# Patient Record
Sex: Female | Born: 1975
Health system: Southern US, Community
[De-identification: ages and names within clinical notes are randomized; demographics above are authoritative.]

## PROBLEM LIST (undated history)

## (undated) DIAGNOSIS — S3769XA Other injury of uterus, initial encounter: Secondary | ICD-10-CM

## (undated) HISTORY — PX: DILATION AND CURETTAGE OF UTERUS: SHX78

## (undated) HISTORY — PX: BREAST REDUCTION SURGERY: SHX8

## (undated) HISTORY — DX: Other injury of uterus, initial encounter: S37.69XA

---

## 1998-08-09 ENCOUNTER — Inpatient Hospital Stay (HOSPITAL_COMMUNITY): Admission: AD | Admit: 1998-08-09 | Discharge: 1998-08-09 | Payer: Self-pay | Admitting: *Deleted

## 1998-12-07 ENCOUNTER — Inpatient Hospital Stay (HOSPITAL_COMMUNITY): Admission: AD | Admit: 1998-12-07 | Discharge: 1998-12-09 | Payer: Self-pay | Admitting: Obstetrics & Gynecology

## 1999-01-10 ENCOUNTER — Other Ambulatory Visit: Admission: RE | Admit: 1999-01-10 | Discharge: 1999-01-10 | Payer: Self-pay | Admitting: Obstetrics & Gynecology

## 2000-02-21 ENCOUNTER — Other Ambulatory Visit: Admission: RE | Admit: 2000-02-21 | Discharge: 2000-02-21 | Payer: Self-pay | Admitting: Obstetrics & Gynecology

## 2001-11-29 ENCOUNTER — Inpatient Hospital Stay (HOSPITAL_COMMUNITY): Admission: AD | Admit: 2001-11-29 | Discharge: 2001-11-29 | Payer: Self-pay | Admitting: *Deleted

## 2001-12-24 ENCOUNTER — Inpatient Hospital Stay (HOSPITAL_COMMUNITY): Admission: AD | Admit: 2001-12-24 | Discharge: 2001-12-26 | Payer: Self-pay | Admitting: Obstetrics & Gynecology

## 2003-07-08 ENCOUNTER — Other Ambulatory Visit: Admission: RE | Admit: 2003-07-08 | Discharge: 2003-07-08 | Payer: Self-pay | Admitting: Obstetrics & Gynecology

## 2005-05-15 ENCOUNTER — Other Ambulatory Visit: Admission: RE | Admit: 2005-05-15 | Discharge: 2005-05-15 | Payer: Self-pay | Admitting: Obstetrics & Gynecology

## 2006-08-19 HISTORY — PX: REDUCTION MAMMAPLASTY: SUR839

## 2012-01-18 DIAGNOSIS — S3769XA Other injury of uterus, initial encounter: Secondary | ICD-10-CM

## 2012-01-18 HISTORY — PX: TUBAL LIGATION: SHX77

## 2012-01-18 HISTORY — DX: Other injury of uterus, initial encounter: S37.69XA

## 2012-01-30 ENCOUNTER — Encounter: Payer: Self-pay | Admitting: Internal Medicine

## 2012-01-30 ENCOUNTER — Other Ambulatory Visit (HOSPITAL_COMMUNITY): Payer: Self-pay | Admitting: Obstetrics & Gynecology

## 2012-01-30 DIAGNOSIS — R1031 Right lower quadrant pain: Secondary | ICD-10-CM

## 2012-02-04 HISTORY — PX: IUD REMOVAL: SHX5392

## 2012-02-05 ENCOUNTER — Ambulatory Visit (HOSPITAL_COMMUNITY): Payer: Self-pay

## 2012-02-10 ENCOUNTER — Ambulatory Visit (HOSPITAL_COMMUNITY)
Admission: RE | Admit: 2012-02-10 | Discharge: 2012-02-10 | Disposition: A | Discharge status: Home / Self Care | Payer: Managed Care, Other (non HMO) | Source: Ambulatory Visit | Attending: Obstetrics & Gynecology | Admitting: Obstetrics & Gynecology

## 2012-02-10 DIAGNOSIS — R1031 Right lower quadrant pain: Secondary | ICD-10-CM

## 2012-02-10 DIAGNOSIS — R1011 Right upper quadrant pain: Secondary | ICD-10-CM | POA: Insufficient documentation

## 2012-02-27 ENCOUNTER — Ambulatory Visit: Payer: Self-pay | Admitting: Internal Medicine

## 2018-05-12 ENCOUNTER — Encounter: Payer: Self-pay | Admitting: Family Medicine

## 2018-05-12 ENCOUNTER — Ambulatory Visit: Payer: Self-pay | Admitting: Family Medicine

## 2018-05-12 VITALS — BP 120/82 | HR 88 | Temp 98.5°F | Wt 159.6 lb

## 2018-05-12 DIAGNOSIS — J0110 Acute frontal sinusitis, unspecified: Secondary | ICD-10-CM

## 2018-05-12 MED ORDER — FLUTICASONE PROPIONATE 50 MCG/ACT NA SUSP: 2.0000 | Freq: Every day | NASAL | 6 refills | Status: AC

## 2018-05-12 MED ORDER — CETIRIZINE HCL 10 MG PO TABS: 10.0000 mg | ORAL_TABLET | Freq: Every day | ORAL | 0 refills | Status: AC

## 2018-05-12 MED ORDER — PSEUDOEPH-BROMPHEN-DM 30-2-10 MG/5ML PO SYRP: 10.0000 mL | ORAL_SOLUTION | Freq: Three times a day (TID) | ORAL | 0 refills | Status: AC | PRN

## 2018-05-12 MED ORDER — AMOXICILLIN-POT CLAVULANATE 875-125 MG PO TABS: 1.0000 | ORAL_TABLET | Freq: Two times a day (BID) | ORAL | 0 refills | Status: DC

## 2018-05-12 NOTE — Patient Instructions (Signed)

## 2018-05-12 NOTE — Progress Notes (Signed)
Subjective:  Alyssa Clarke is a 42 y.o. female who presents for evaluation of possible sinusitis.  Symptoms include cough described as productive, facial pain, fever: suspected fevers but not measured at home, headache described as in frontal sinuses and nasal congestion.  Onset of symptoms was 9 days ago, and has been rapidly worsening since that time.  Treatment to date:  cough suppressants.  High risk factors for influenza complications:  Denies any body ache, high fever.  The following portions of the patient's history were reviewed and updated as appropriate:  allergies, current medications and past medical history.  Review of Systems  Constitutional: Positive for fever.  HENT: Positive for congestion and sinus pain.   Respiratory: Positive for cough and sputum production. Negative for shortness of breath and wheezing.   Cardiovascular: Negative for chest pain.  Gastrointestinal: Negative for abdominal pain, nausea and vomiting.  Musculoskeletal: Negative for myalgias.  Neurological: Positive for headaches. Loss of consciousness:     Objective:  General appearance: no distress   Physical Exam  Constitutional: She is oriented to person, place, and time and well-developed, well-nourished, and in no distress. No distress.  HENT:  Head: Normocephalic and atraumatic.  Nose: Rhinorrhea (clear, trace bilaterally) and sinus tenderness (over maxillary and frontal sinuses) present.  Mouth/Throat: Oropharyngeal exudate (clear postanasal drainage) present.  Cardiovascular: Normal rate and regular rhythm.  Pulmonary/Chest: No respiratory distress. She has no wheezes. She has no rhonchi. She has no rales.  Abdominal: Soft. Bowel sounds are normal. There is no tenderness.  Neurological: She is alert and oriented to person, place, and time.  Skin: Skin is warm and dry.     Assessment:   1. Acute frontal sinusitis, recurrence not specified        Plan:  Discussed diagnosis and treatment  of sinusitis.  1. Acute frontal sinusitis, recurrence not specified - cetirizine (ZYRTEC) 10 MG tablet; Take 1 tablet (10 mg total) by mouth daily. - fluticasone (FLONASE) 50 MCG/ACT nasal spray; Place 2 sprays into both nostrils daily. - brompheniramine-pseudoephedrine-DM 30-2-10 MG/5ML syrup; Take 10 mLs by mouth 3 (three) times daily as needed. - amoxicillin-clavulanate (AUGMENTIN) 875-125 MG tablet; Take 1 tablet by mouth 2 (two) times daily.   Advised patient that if cough worsens, return to our clinic or follow up with your primary care providers.

## 2018-05-14 ENCOUNTER — Telehealth: Payer: Self-pay

## 2018-05-14 NOTE — Telephone Encounter (Signed)
Patient was not available.

## 2018-09-16 DIAGNOSIS — Z6828 Body mass index (BMI) 28.0-28.9, adult: Secondary | ICD-10-CM | POA: Diagnosis not present

## 2018-09-16 DIAGNOSIS — Z01419 Encounter for gynecological examination (general) (routine) without abnormal findings: Secondary | ICD-10-CM | POA: Diagnosis not present

## 2018-09-16 DIAGNOSIS — Z1231 Encounter for screening mammogram for malignant neoplasm of breast: Secondary | ICD-10-CM | POA: Diagnosis not present

## 2018-09-17 ENCOUNTER — Other Ambulatory Visit: Payer: Self-pay | Admitting: Obstetrics & Gynecology

## 2018-09-17 DIAGNOSIS — R928 Other abnormal and inconclusive findings on diagnostic imaging of breast: Secondary | ICD-10-CM

## 2018-09-23 ENCOUNTER — Ambulatory Visit: Payer: Managed Care, Other (non HMO)

## 2018-09-23 ENCOUNTER — Ambulatory Visit
Admission: RE | Admit: 2018-09-23 | Discharge: 2018-09-23 | Disposition: A | Discharge status: Home / Self Care | Payer: BLUE CROSS/BLUE SHIELD | Source: Ambulatory Visit | Attending: Obstetrics & Gynecology | Admitting: Obstetrics & Gynecology

## 2018-09-23 DIAGNOSIS — R928 Other abnormal and inconclusive findings on diagnostic imaging of breast: Secondary | ICD-10-CM

## 2018-09-23 DIAGNOSIS — R922 Inconclusive mammogram: Secondary | ICD-10-CM | POA: Diagnosis not present

## 2018-09-30 DIAGNOSIS — K648 Other hemorrhoids: Secondary | ICD-10-CM | POA: Diagnosis not present

## 2018-09-30 DIAGNOSIS — K644 Residual hemorrhoidal skin tags: Secondary | ICD-10-CM | POA: Diagnosis not present

## 2018-10-01 DIAGNOSIS — F320 Major depressive disorder, single episode, mild: Secondary | ICD-10-CM | POA: Diagnosis not present

## 2018-10-01 DIAGNOSIS — Z1389 Encounter for screening for other disorder: Secondary | ICD-10-CM | POA: Diagnosis not present

## 2018-10-01 DIAGNOSIS — E663 Overweight: Secondary | ICD-10-CM | POA: Diagnosis not present

## 2018-10-01 DIAGNOSIS — Z6829 Body mass index (BMI) 29.0-29.9, adult: Secondary | ICD-10-CM | POA: Diagnosis not present

## 2018-10-28 DIAGNOSIS — E663 Overweight: Secondary | ICD-10-CM | POA: Diagnosis not present

## 2018-10-28 DIAGNOSIS — Z1389 Encounter for screening for other disorder: Secondary | ICD-10-CM | POA: Diagnosis not present

## 2018-10-28 DIAGNOSIS — Z6828 Body mass index (BMI) 28.0-28.9, adult: Secondary | ICD-10-CM | POA: Diagnosis not present

## 2018-10-28 DIAGNOSIS — J069 Acute upper respiratory infection, unspecified: Secondary | ICD-10-CM | POA: Diagnosis not present

## 2018-10-28 DIAGNOSIS — J111 Influenza due to unidentified influenza virus with other respiratory manifestations: Secondary | ICD-10-CM | POA: Diagnosis not present

## 2019-02-24 DIAGNOSIS — E663 Overweight: Secondary | ICD-10-CM | POA: Diagnosis not present

## 2019-02-24 DIAGNOSIS — M461 Sacroiliitis, not elsewhere classified: Secondary | ICD-10-CM | POA: Diagnosis not present

## 2019-02-24 DIAGNOSIS — Z1389 Encounter for screening for other disorder: Secondary | ICD-10-CM | POA: Diagnosis not present

## 2019-02-24 DIAGNOSIS — Z6828 Body mass index (BMI) 28.0-28.9, adult: Secondary | ICD-10-CM | POA: Diagnosis not present

## 2019-04-06 DIAGNOSIS — F900 Attention-deficit hyperactivity disorder, predominantly inattentive type: Secondary | ICD-10-CM | POA: Diagnosis not present

## 2019-04-06 DIAGNOSIS — F3342 Major depressive disorder, recurrent, in full remission: Secondary | ICD-10-CM | POA: Diagnosis not present

## 2019-08-24 DIAGNOSIS — F4323 Adjustment disorder with mixed anxiety and depressed mood: Secondary | ICD-10-CM | POA: Diagnosis not present

## 2019-09-28 ENCOUNTER — Ambulatory Visit (HOSPITAL_COMMUNITY)
Admission: RE | Admit: 2019-09-28 | Discharge: 2019-09-28 | Disposition: A | Discharge status: Home / Self Care | Payer: BC Managed Care – PPO | Source: Ambulatory Visit | Attending: Family Medicine | Admitting: Family Medicine

## 2019-09-28 ENCOUNTER — Other Ambulatory Visit (HOSPITAL_COMMUNITY): Payer: Self-pay | Admitting: Family Medicine

## 2019-09-28 ENCOUNTER — Other Ambulatory Visit: Payer: Self-pay

## 2019-09-28 DIAGNOSIS — Z1389 Encounter for screening for other disorder: Secondary | ICD-10-CM | POA: Diagnosis not present

## 2019-09-28 DIAGNOSIS — Z6829 Body mass index (BMI) 29.0-29.9, adult: Secondary | ICD-10-CM | POA: Diagnosis not present

## 2019-09-28 DIAGNOSIS — M549 Dorsalgia, unspecified: Secondary | ICD-10-CM

## 2019-09-28 DIAGNOSIS — E663 Overweight: Secondary | ICD-10-CM | POA: Diagnosis not present

## 2019-11-25 DIAGNOSIS — F900 Attention-deficit hyperactivity disorder, predominantly inattentive type: Secondary | ICD-10-CM | POA: Diagnosis not present

## 2019-11-25 DIAGNOSIS — F3342 Major depressive disorder, recurrent, in full remission: Secondary | ICD-10-CM | POA: Diagnosis not present

## 2019-12-29 DIAGNOSIS — Z6826 Body mass index (BMI) 26.0-26.9, adult: Secondary | ICD-10-CM | POA: Diagnosis not present

## 2019-12-29 DIAGNOSIS — Z01419 Encounter for gynecological examination (general) (routine) without abnormal findings: Secondary | ICD-10-CM | POA: Diagnosis not present

## 2019-12-29 DIAGNOSIS — Z1231 Encounter for screening mammogram for malignant neoplasm of breast: Secondary | ICD-10-CM | POA: Diagnosis not present

## 2019-12-30 DIAGNOSIS — Z01419 Encounter for gynecological examination (general) (routine) without abnormal findings: Secondary | ICD-10-CM | POA: Diagnosis not present

## 2020-01-03 ENCOUNTER — Other Ambulatory Visit: Payer: Self-pay | Admitting: Obstetrics & Gynecology

## 2020-01-03 DIAGNOSIS — R928 Other abnormal and inconclusive findings on diagnostic imaging of breast: Secondary | ICD-10-CM

## 2020-01-04 DIAGNOSIS — M533 Sacrococcygeal disorders, not elsewhere classified: Secondary | ICD-10-CM | POA: Diagnosis not present

## 2020-01-04 DIAGNOSIS — N83201 Unspecified ovarian cyst, right side: Secondary | ICD-10-CM | POA: Diagnosis not present

## 2020-01-24 ENCOUNTER — Other Ambulatory Visit: Payer: Self-pay

## 2020-01-24 ENCOUNTER — Ambulatory Visit
Admission: RE | Admit: 2020-01-24 | Discharge: 2020-01-24 | Disposition: A | Discharge status: Home / Self Care | Payer: BC Managed Care – PPO | Source: Ambulatory Visit | Attending: Obstetrics & Gynecology | Admitting: Obstetrics & Gynecology

## 2020-01-24 ENCOUNTER — Other Ambulatory Visit: Payer: Self-pay | Admitting: Obstetrics & Gynecology

## 2020-01-24 DIAGNOSIS — R928 Other abnormal and inconclusive findings on diagnostic imaging of breast: Secondary | ICD-10-CM | POA: Diagnosis not present

## 2020-01-24 DIAGNOSIS — Z853 Personal history of malignant neoplasm of breast: Secondary | ICD-10-CM | POA: Diagnosis not present

## 2020-01-24 DIAGNOSIS — N6323 Unspecified lump in the left breast, lower outer quadrant: Secondary | ICD-10-CM | POA: Diagnosis not present

## 2020-01-25 DIAGNOSIS — M545 Low back pain: Secondary | ICD-10-CM | POA: Diagnosis not present

## 2020-02-16 DIAGNOSIS — M545 Low back pain: Secondary | ICD-10-CM | POA: Diagnosis not present

## 2020-03-14 DIAGNOSIS — M545 Low back pain: Secondary | ICD-10-CM | POA: Diagnosis not present

## 2020-03-16 DIAGNOSIS — M545 Low back pain: Secondary | ICD-10-CM | POA: Diagnosis not present

## 2020-03-23 DIAGNOSIS — M545 Low back pain: Secondary | ICD-10-CM | POA: Diagnosis not present

## 2020-03-28 DIAGNOSIS — M545 Low back pain: Secondary | ICD-10-CM | POA: Diagnosis not present

## 2020-03-30 DIAGNOSIS — M545 Low back pain: Secondary | ICD-10-CM | POA: Diagnosis not present

## 2020-04-06 DIAGNOSIS — M545 Low back pain: Secondary | ICD-10-CM | POA: Diagnosis not present

## 2020-04-11 DIAGNOSIS — M545 Low back pain: Secondary | ICD-10-CM | POA: Diagnosis not present

## 2020-04-13 DIAGNOSIS — M545 Low back pain: Secondary | ICD-10-CM | POA: Diagnosis not present

## 2020-05-23 DIAGNOSIS — E663 Overweight: Secondary | ICD-10-CM | POA: Diagnosis not present

## 2020-05-23 DIAGNOSIS — M5410 Radiculopathy, site unspecified: Secondary | ICD-10-CM | POA: Diagnosis not present

## 2020-05-23 DIAGNOSIS — Z6827 Body mass index (BMI) 27.0-27.9, adult: Secondary | ICD-10-CM | POA: Diagnosis not present

## 2020-07-19 DIAGNOSIS — F4323 Adjustment disorder with mixed anxiety and depressed mood: Secondary | ICD-10-CM | POA: Diagnosis not present

## 2020-07-25 DIAGNOSIS — F4323 Adjustment disorder with mixed anxiety and depressed mood: Secondary | ICD-10-CM | POA: Diagnosis not present

## 2020-07-27 ENCOUNTER — Other Ambulatory Visit: Payer: Self-pay

## 2020-07-27 ENCOUNTER — Ambulatory Visit
Admission: RE | Admit: 2020-07-27 | Discharge: 2020-07-27 | Disposition: A | Discharge status: Home / Self Care | Payer: BC Managed Care – PPO | Source: Ambulatory Visit | Attending: Obstetrics & Gynecology | Admitting: Obstetrics & Gynecology

## 2020-07-27 DIAGNOSIS — R928 Other abnormal and inconclusive findings on diagnostic imaging of breast: Secondary | ICD-10-CM

## 2020-07-27 DIAGNOSIS — N6002 Solitary cyst of left breast: Secondary | ICD-10-CM | POA: Diagnosis not present

## 2020-08-02 DIAGNOSIS — F4323 Adjustment disorder with mixed anxiety and depressed mood: Secondary | ICD-10-CM | POA: Diagnosis not present

## 2020-08-23 DIAGNOSIS — F4323 Adjustment disorder with mixed anxiety and depressed mood: Secondary | ICD-10-CM | POA: Diagnosis not present

## 2020-08-30 DIAGNOSIS — F3342 Major depressive disorder, recurrent, in full remission: Secondary | ICD-10-CM | POA: Diagnosis not present

## 2020-08-30 DIAGNOSIS — F4321 Adjustment disorder with depressed mood: Secondary | ICD-10-CM | POA: Diagnosis not present

## 2020-10-17 ENCOUNTER — Encounter: Payer: Self-pay | Admitting: Emergency Medicine

## 2020-10-17 ENCOUNTER — Other Ambulatory Visit: Payer: Self-pay

## 2020-10-17 ENCOUNTER — Ambulatory Visit
Admission: EM | Admit: 2020-10-17 | Discharge: 2020-10-17 | Disposition: A | Discharge status: Home / Self Care | Payer: BC Managed Care – PPO

## 2020-10-17 DIAGNOSIS — S09302A Unspecified injury of left middle and inner ear, initial encounter: Secondary | ICD-10-CM

## 2020-10-17 DIAGNOSIS — H9202 Otalgia, left ear: Secondary | ICD-10-CM | POA: Diagnosis not present

## 2020-10-17 NOTE — Discharge Instructions (Signed)
Blood visualized behind the ear drum, most likely secondary to ear trauma Rest  Avoid painful activities Alternate ibuprofen and/ or tylenol as needed for pain control Follow up with PCP this week for recheck Return here or go to the ER if you have any new or worsening symptoms fever, chills, nausea, vomiting, purulent drainage, worsening pain, etc..Marland Kitchen

## 2020-10-17 NOTE — ED Provider Notes (Signed)
[facility]   [id number] 10/17/20 Arrival Time: 1217  CC:EAR PAIN  SUBJECTIVE: History from: patient.  Alyssa Clarke is a 45 y.o. female who presents with of left ear pain that began this morning.  Symptoms began after accidentally shoving a q-tip down into LT ear while taking shirt off.  Patient states the pain is constant.  Denies alleviating or aggravating factors.  Denies similar symptoms in the past.   Denies blood in ear canal or on q-tip.   Complains of hearing changes.  Denies fever, chills, fatigue, chest pain, nausea, changes in bowel or bladder habits.    ROS: As per HPI.  All other pertinent ROS negative.     Past Medical History:  Diagnosis Date  . Perforation of uterus 01/2012   from IUD   Past Surgical History:  Procedure Laterality Date  . BREAST REDUCTION SURGERY    . DILATION AND CURETTAGE OF UTERUS    . IUD REMOVAL  02/04/2012  . REDUCTION MAMMAPLASTY Bilateral 2008  . TUBAL LIGATION  01/2012   bilateral  . VAGINAL DELIVERY     x 3   Allergies  Allergen Reactions  . Codeine Hives   No current facility-administered medications on file prior to encounter.   Current Outpatient Medications on File Prior to Encounter  Medication Sig Dispense Refill  . buPROPion (WELLBUTRIN SR) 150 MG 12 hr tablet Take 150 mg by mouth 2 (two) times daily.    Marland Kitchen escitalopram (LEXAPRO) 20 MG tablet Take 20 mg by mouth daily.    . brompheniramine-pseudoephedrine-DM 30-2-10 MG/5ML syrup Take 10 mLs by mouth 3 (three) times daily as needed. 120 mL 0  . cetirizine (ZYRTEC) 10 MG tablet Take 1 tablet (10 mg total) by mouth daily. 30 tablet 0  . fluticasone (FLONASE) 50 MCG/ACT nasal spray Place 2 sprays into both nostrils daily. 16 g 6   Social History   Socioeconomic History  . Marital status: Married    Spouse name: Not on file  . Number of children: Not on file  . Years of education: Not on file  . Highest education level: Not on file  Occupational History  . Not  on file  Tobacco Use  . Smoking status: Never Smoker  . Smokeless tobacco: Never Used  Substance and Sexual Activity  . Alcohol use: Not Currently  . Drug use: Never  . Sexual activity: Not on file  Other Topics Concern  . Not on file  Social History Narrative  . Not on file   Social Determinants of Health   Financial Resource Strain: Not on file  Food Insecurity: Not on file  Transportation Needs: Not on file  Physical Activity: Not on file  Stress: Not on file  Social Connections: Not on file  Intimate Partner Violence: Not on file   Family History  Problem Relation Age of Onset  . Colon cancer Other     OBJECTIVE:  Vitals:   10/17/20 1251  BP: 131/79  Pulse: 85  Resp: 18  Temp: 98.9 F (37.2 C)  TempSrc: Oral  SpO2: 96%     General appearance: alert; well-appearing, nontoxic; speaking in full sentences and tolerating own secretions HEENT: NCAT; Ears: EACs clear, RT TM pearly gray, blood present behind LT TM; Eyes: PERRL.  EOM grossly intact.Nose: nares patent without rhinorrhea, Throat: oropharynx clear, tonsils non erythematous or enlarged, uvula midline  Neck: supple without LAD Lungs: normal respirations Skin: warm and dry Psychological: alert and cooperative; normal mood and affect  ASSESSMENT & PLAN:  1. Ear pain, left   2. Injury of tympanic membrane of left ear, initial encounter    Blood visualized behind the ear drum, most likely secondary to ear trauma Rest  Avoid painful activities Alternate ibuprofen and/ or tylenol as needed for pain control Follow up with PCP this week for recheck Return here or go to the ER if you have any new or worsening symptoms fever, chills, nausea, vomiting, purulent drainage, worsening pain, etc...  Reviewed expectations re: course of current medical issues. Questions answered. Outlined signs and symptoms indicating need for more acute intervention. Patient verbalized understanding. After Visit Summary  given.         Rennis Harding, PA-C 10/17/20 1323

## 2020-10-17 NOTE — ED Triage Notes (Signed)
Q-tip was pushed down in left ear.  Can't hear, feels like there is some liquid in ear.

## 2020-12-21 DIAGNOSIS — S233XXA Sprain of ligaments of thoracic spine, initial encounter: Secondary | ICD-10-CM | POA: Diagnosis not present

## 2020-12-21 DIAGNOSIS — S336XXA Sprain of sacroiliac joint, initial encounter: Secondary | ICD-10-CM | POA: Diagnosis not present

## 2020-12-21 DIAGNOSIS — S134XXA Sprain of ligaments of cervical spine, initial encounter: Secondary | ICD-10-CM | POA: Diagnosis not present

## 2020-12-28 DIAGNOSIS — S134XXA Sprain of ligaments of cervical spine, initial encounter: Secondary | ICD-10-CM | POA: Diagnosis not present

## 2020-12-28 DIAGNOSIS — S233XXA Sprain of ligaments of thoracic spine, initial encounter: Secondary | ICD-10-CM | POA: Diagnosis not present

## 2020-12-28 DIAGNOSIS — S336XXA Sprain of sacroiliac joint, initial encounter: Secondary | ICD-10-CM | POA: Diagnosis not present

## 2021-01-02 DIAGNOSIS — S336XXA Sprain of sacroiliac joint, initial encounter: Secondary | ICD-10-CM | POA: Diagnosis not present

## 2021-01-02 DIAGNOSIS — S134XXA Sprain of ligaments of cervical spine, initial encounter: Secondary | ICD-10-CM | POA: Diagnosis not present

## 2021-01-02 DIAGNOSIS — S233XXA Sprain of ligaments of thoracic spine, initial encounter: Secondary | ICD-10-CM | POA: Diagnosis not present

## 2021-01-03 DIAGNOSIS — Z01419 Encounter for gynecological examination (general) (routine) without abnormal findings: Secondary | ICD-10-CM | POA: Diagnosis not present

## 2021-01-03 DIAGNOSIS — Z6828 Body mass index (BMI) 28.0-28.9, adult: Secondary | ICD-10-CM | POA: Diagnosis not present

## 2021-01-03 DIAGNOSIS — Z1231 Encounter for screening mammogram for malignant neoplasm of breast: Secondary | ICD-10-CM | POA: Diagnosis not present

## 2021-01-04 DIAGNOSIS — S336XXA Sprain of sacroiliac joint, initial encounter: Secondary | ICD-10-CM | POA: Diagnosis not present

## 2021-01-04 DIAGNOSIS — S233XXA Sprain of ligaments of thoracic spine, initial encounter: Secondary | ICD-10-CM | POA: Diagnosis not present

## 2021-01-04 DIAGNOSIS — S134XXA Sprain of ligaments of cervical spine, initial encounter: Secondary | ICD-10-CM | POA: Diagnosis not present

## 2021-01-12 DIAGNOSIS — S336XXA Sprain of sacroiliac joint, initial encounter: Secondary | ICD-10-CM | POA: Diagnosis not present

## 2021-01-12 DIAGNOSIS — S134XXA Sprain of ligaments of cervical spine, initial encounter: Secondary | ICD-10-CM | POA: Diagnosis not present

## 2021-01-12 DIAGNOSIS — S233XXA Sprain of ligaments of thoracic spine, initial encounter: Secondary | ICD-10-CM | POA: Diagnosis not present

## 2021-03-14 DIAGNOSIS — E663 Overweight: Secondary | ICD-10-CM | POA: Diagnosis not present

## 2021-03-14 DIAGNOSIS — J329 Chronic sinusitis, unspecified: Secondary | ICD-10-CM | POA: Diagnosis not present

## 2021-03-14 DIAGNOSIS — F320 Major depressive disorder, single episode, mild: Secondary | ICD-10-CM | POA: Diagnosis not present

## 2021-03-14 DIAGNOSIS — Z6827 Body mass index (BMI) 27.0-27.9, adult: Secondary | ICD-10-CM | POA: Diagnosis not present

## 2021-03-19 ENCOUNTER — Other Ambulatory Visit (HOSPITAL_COMMUNITY): Payer: Self-pay | Admitting: Internal Medicine

## 2021-03-19 DIAGNOSIS — Z1231 Encounter for screening mammogram for malignant neoplasm of breast: Secondary | ICD-10-CM

## 2021-05-08 DIAGNOSIS — K648 Other hemorrhoids: Secondary | ICD-10-CM | POA: Diagnosis not present

## 2021-05-08 DIAGNOSIS — Z1211 Encounter for screening for malignant neoplasm of colon: Secondary | ICD-10-CM | POA: Diagnosis not present

## 2021-07-27 DIAGNOSIS — F900 Attention-deficit hyperactivity disorder, predominantly inattentive type: Secondary | ICD-10-CM | POA: Diagnosis not present

## 2021-07-27 DIAGNOSIS — F3342 Major depressive disorder, recurrent, in full remission: Secondary | ICD-10-CM | POA: Diagnosis not present

## 2021-09-11 DIAGNOSIS — J069 Acute upper respiratory infection, unspecified: Secondary | ICD-10-CM | POA: Diagnosis not present

## 2021-09-11 DIAGNOSIS — E663 Overweight: Secondary | ICD-10-CM | POA: Diagnosis not present

## 2021-10-13 IMAGING — MG MM DIGITAL DIAGNOSTIC UNILAT*L* W/ TOMO W/ CAD
4 series · 4 of 12 positions shown · non-contrast
Comparison: Previous exam(s).

CLINICAL DATA: Screening recall for a possible left breast mass.
The patient has history of reduction mammoplasty.

EXAM:
DIGITAL DIAGNOSTIC UNILATERAL LEFT MAMMOGRAM WITH CAD AND TOMO
LEFT BREAST ULTRASOUND

[L MLO synth-2D]
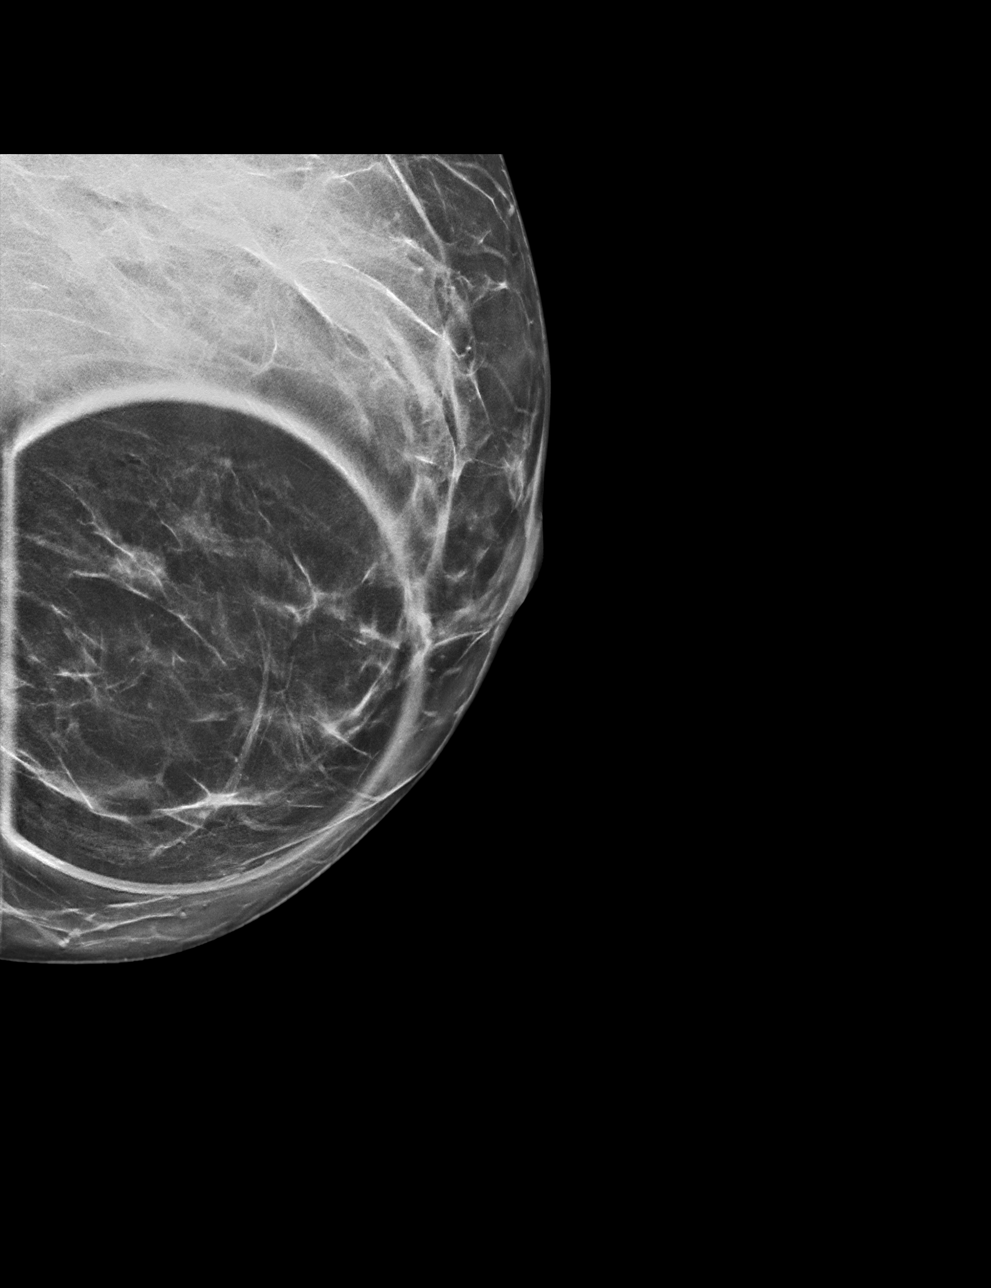

[L CC synth-2D]
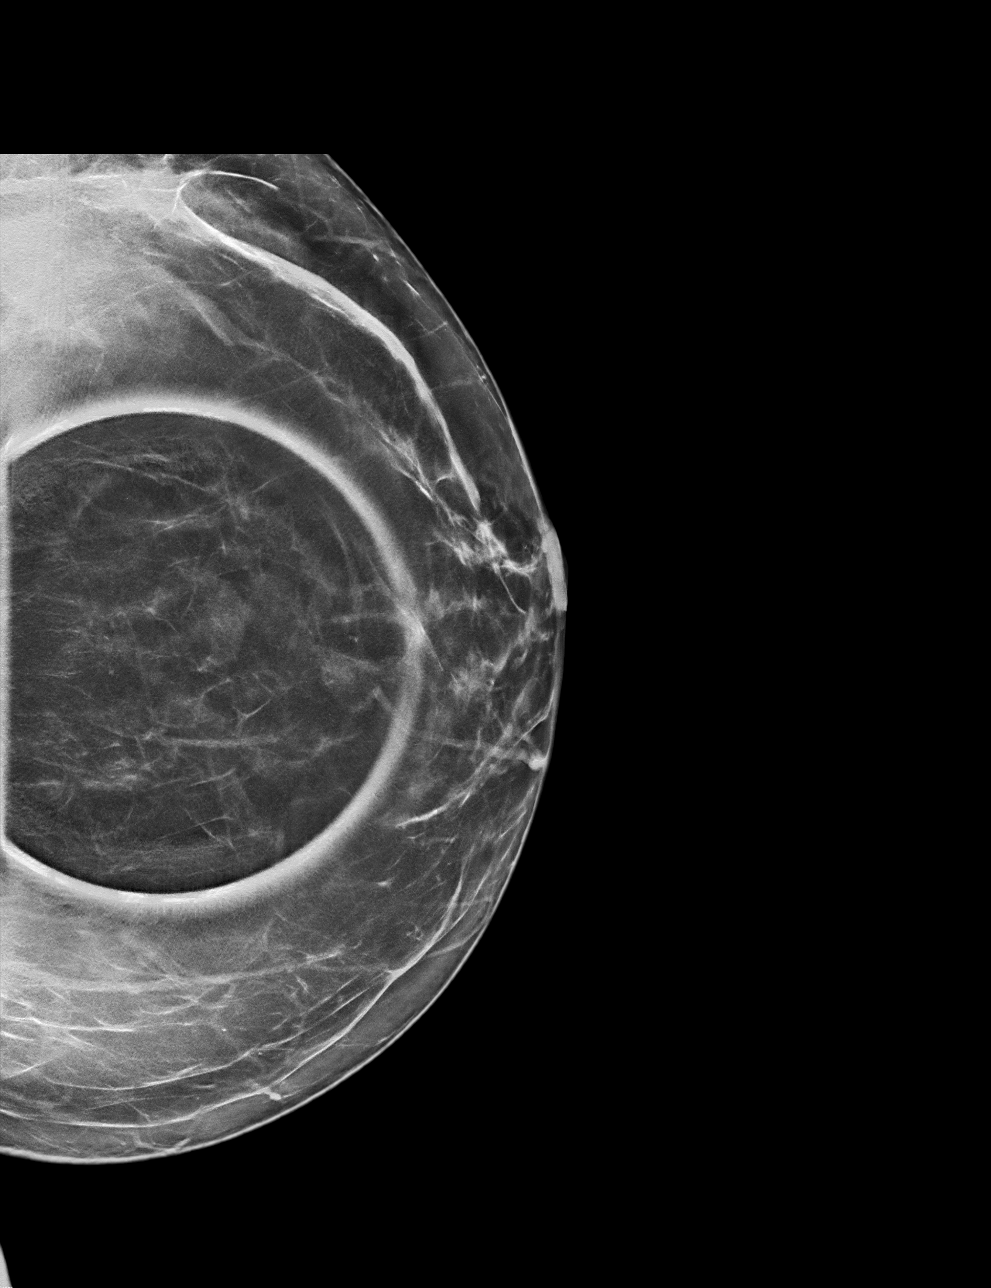

[L MLO tomo · tomo slice 35/69.0]
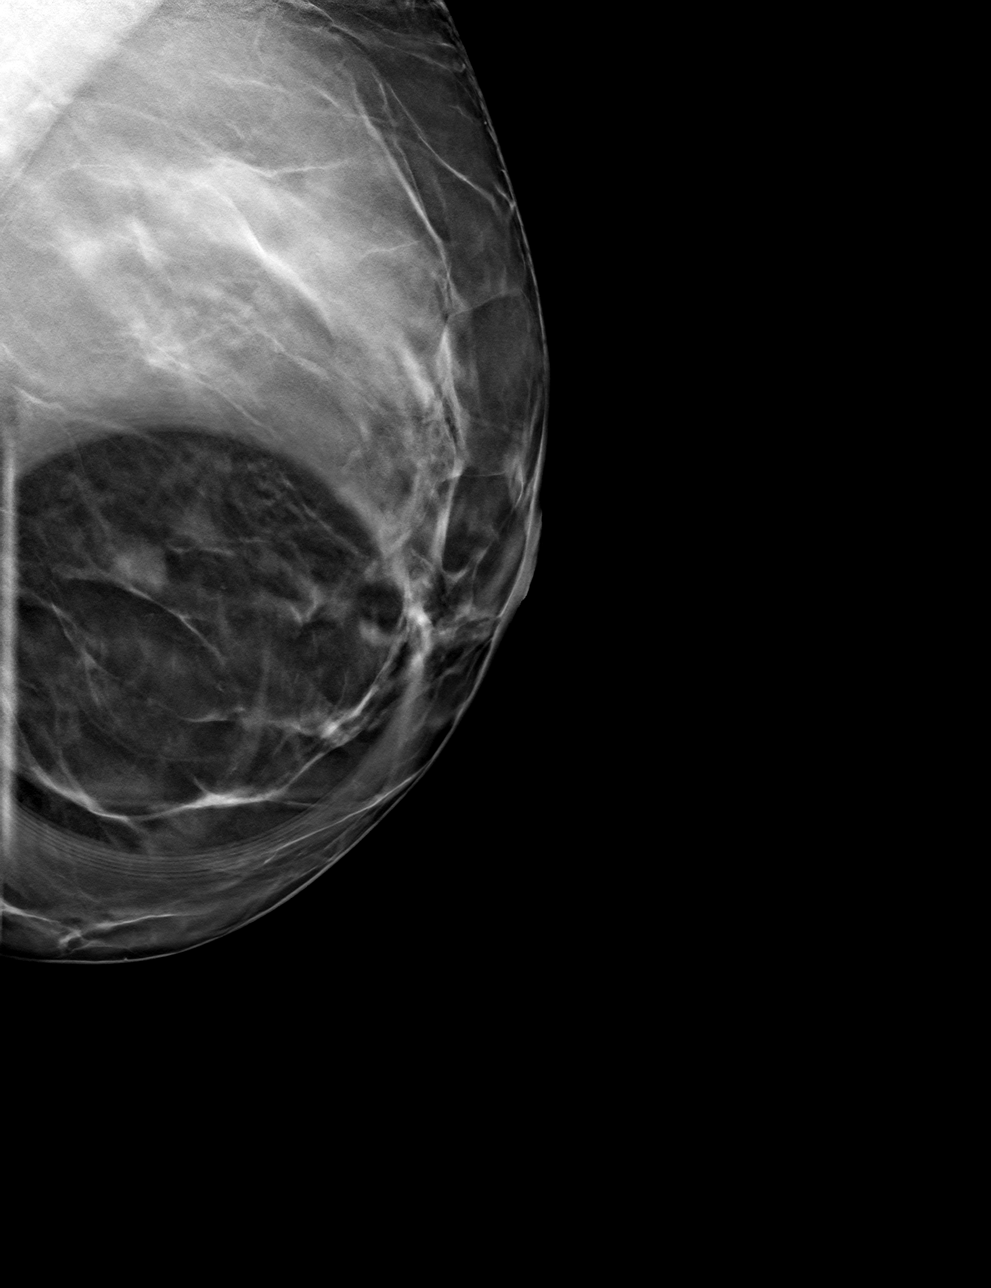

[L CC tomo · tomo slice 35/68.0]
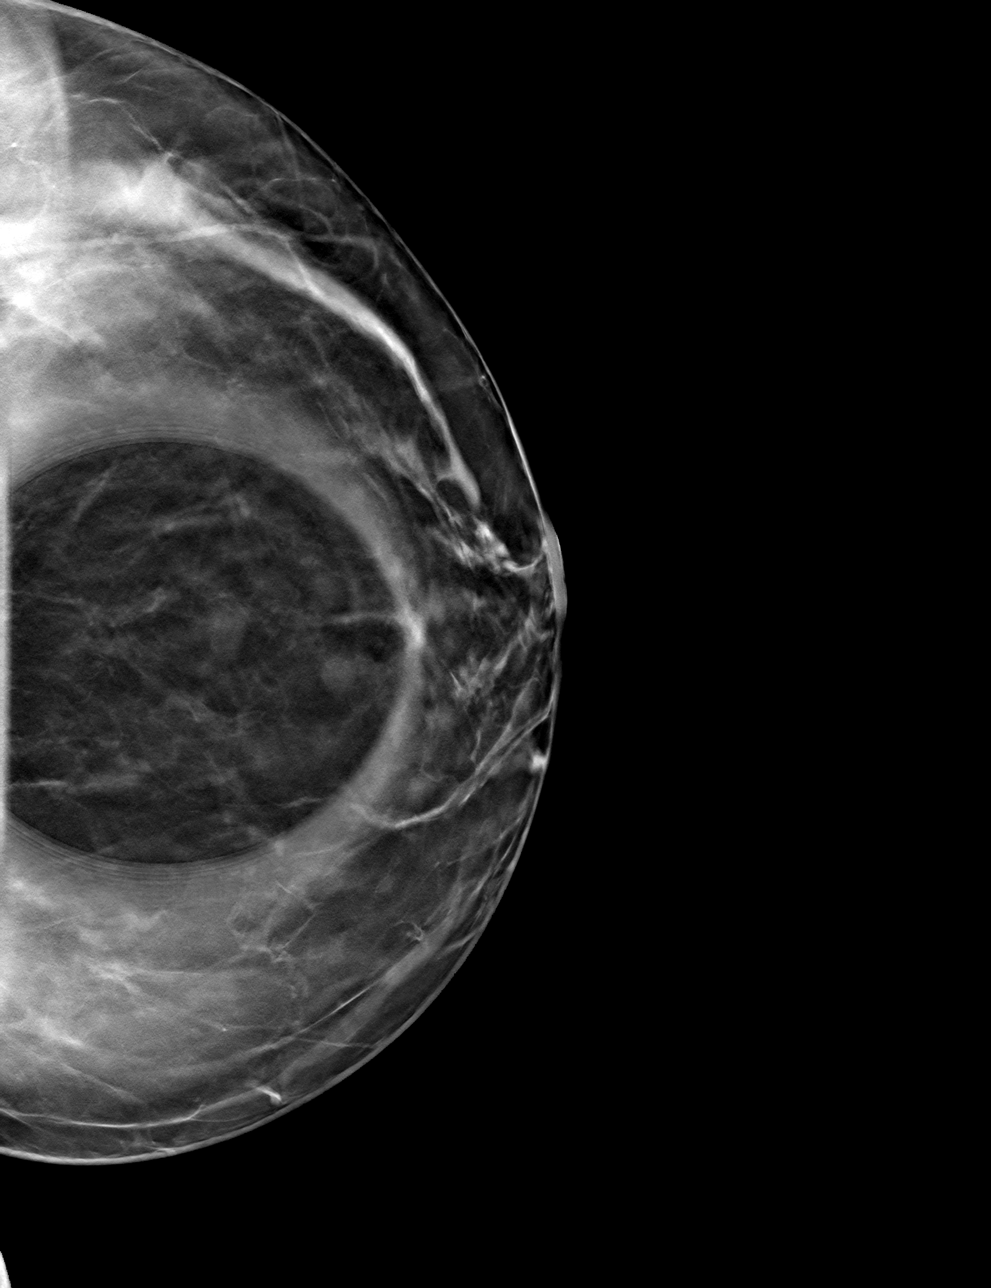

[4 of 12 positions shown; findings below may reference images not displayed]

ACR Breast Density Category b: There are scattered areas of
fibroglandular density.
FINDINGS: Spot compression tomosynthesis images through the inferior left
breast demonstrates an irregular mass with indistinct margins
measuring approximately 1.7 cm on the CC view.

Mammographic images were processed with CAD.

Ultrasound targeted to the left breast at [DATE], 3 cm from the nipple
demonstrates a circumscribed oval anechoic mass with internal
septations measuring 1.1 x 0 3 x 1.0 cm.
IMPRESSION: The mass in the left breast at [DATE] is likely benign, favored to
represent a complicated cyst.

RECOMMENDATION:
Six-month follow-up left breast ultrasound.

I have discussed the findings and recommendations with the patient.
If applicable, a reminder letter will be sent to the patient
regarding the next appointment.

BI-RADS CATEGORY  3: Probably benign.

## 2022-02-13 ENCOUNTER — Other Ambulatory Visit: Payer: Self-pay

## 2022-02-13 ENCOUNTER — Emergency Department (HOSPITAL_COMMUNITY): Payer: BC Managed Care – PPO

## 2022-02-13 ENCOUNTER — Emergency Department (HOSPITAL_COMMUNITY)
Admission: EM | Admit: 2022-02-13 | Discharge: 2022-02-13 | Discharge status: Against Medical Advice | Payer: BC Managed Care – PPO | Attending: Emergency Medicine | Admitting: Emergency Medicine

## 2022-02-13 ENCOUNTER — Encounter (HOSPITAL_COMMUNITY): Payer: Self-pay | Admitting: Pharmacy Technician

## 2022-02-13 DIAGNOSIS — F419 Anxiety disorder, unspecified: Secondary | ICD-10-CM | POA: Diagnosis not present

## 2022-02-13 DIAGNOSIS — Z5321 Procedure and treatment not carried out due to patient leaving prior to being seen by health care provider: Secondary | ICD-10-CM | POA: Insufficient documentation

## 2022-02-13 DIAGNOSIS — R0789 Other chest pain: Secondary | ICD-10-CM | POA: Diagnosis not present

## 2022-02-13 DIAGNOSIS — Y990 Civilian activity done for income or pay: Secondary | ICD-10-CM | POA: Diagnosis not present

## 2022-02-13 LAB — BASIC METABOLIC PANEL
Anion gap: 8 (ref 5–15)
BUN: 12 mg/dL (ref 6–20)
CO2: 23 mmol/L (ref 22–32)
Calcium: 8.8 mg/dL — ABNORMAL LOW (ref 8.9–10.3)
Chloride: 108 mmol/L (ref 98–111)
Creatinine, Ser: 0.77 mg/dL (ref 0.44–1.00)
GFR, Estimated: >60 mL/min (ref >60)
Glucose, Bld: 99 mg/dL (ref 70–99)
Potassium: 4.6 mmol/L (ref 3.5–5.1)
Sodium: 139 mmol/L (ref 135–145)

## 2022-02-13 LAB — CBC
HCT: 41.2 % (ref 36.0–46.0)
Hemoglobin: 13 g/dL (ref 12.0–15.0)
MCH: 30.9 pg (ref 26.0–34.0)
MCHC: 31.6 g/dL (ref 30.0–36.0)
MCV: 97.9 fL (ref 80.0–100.0)
Platelets: 346 10*3/uL (ref 150–400)
RBC: 4.21 MIL/uL (ref 3.87–5.11)
RDW: 13.1 % (ref 11.5–15.5)
WBC: 7.8 10*3/uL (ref 4.0–10.5)
nRBC: 0 % (ref 0.0–0.2)

## 2022-02-13 LAB — TROPONIN I (HIGH SENSITIVITY)
Troponin I (High Sensitivity): <2 ng/L (ref <18)
Troponin I (High Sensitivity): 3 ng/L (ref <18)

## 2022-02-13 NOTE — ED Notes (Signed)
Pt and visitor came up to desk and stated that they were leaving. Witnessed by Fredrich Romans RN and Jackelyn Hoehn NT - Pt ambulatory out of ED.

## 2022-02-13 NOTE — ED Provider Triage Note (Signed)
Emergency Medicine Provider Triage Evaluation Note  Alyssa Clarke , a 46 y.o. female  was evaluated in triage.  Pt complains of cp. Endorse chest pressure, tightness, and a bit anxious while at work today.  Report having difficulty thinking. No significant cardiac hx.  No fever, cough, nausea, dizzy but felt clammy  Review of Systems  Positive: As above Negative: As above  Physical Exam  BP 122/75   Pulse 78   Temp 98.6 F (37 C)   Resp 20   LMP 01/03/2012   SpO2 98%  Gen:   Awake, no distress   Resp:  Normal effort  MSK:   Moves extremities without difficulty  Other:    Medical Decision Making  Medically screening exam initiated at 3:05 PM.  Appropriate orders placed.  Alyssa Clarke was informed that the remainder of the evaluation will be completed by another provider, this initial triage assessment does not replace that evaluation, and the importance of remaining in the ED until their evaluation is complete.     Fayrene Helper, PA-C 02/13/22 [phone]

## 2022-02-13 NOTE — ED Triage Notes (Signed)
Pt reports chest pain onset this  morning that is getting worse throughout the day. Pt describes the pain as a tightness. Pt reports having shob and feeling clammy.

## 2022-02-28 DIAGNOSIS — E6609 Other obesity due to excess calories: Secondary | ICD-10-CM | POA: Diagnosis not present

## 2022-02-28 DIAGNOSIS — R1310 Dysphagia, unspecified: Secondary | ICD-10-CM | POA: Diagnosis not present

## 2022-02-28 DIAGNOSIS — Z6829 Body mass index (BMI) 29.0-29.9, adult: Secondary | ICD-10-CM | POA: Diagnosis not present

## 2022-02-28 DIAGNOSIS — B029 Zoster without complications: Secondary | ICD-10-CM | POA: Diagnosis not present

## 2022-03-18 DIAGNOSIS — B0229 Other postherpetic nervous system involvement: Secondary | ICD-10-CM | POA: Diagnosis not present

## 2022-03-18 DIAGNOSIS — B029 Zoster without complications: Secondary | ICD-10-CM | POA: Diagnosis not present

## 2022-04-11 DIAGNOSIS — R1310 Dysphagia, unspecified: Secondary | ICD-10-CM | POA: Diagnosis not present

## 2022-04-11 DIAGNOSIS — K219 Gastro-esophageal reflux disease without esophagitis: Secondary | ICD-10-CM | POA: Diagnosis not present

## 2022-04-16 IMAGING — US US BREAST*L* LIMITED INC AXILLA
1 series · 4 of 4 positions shown · non-contrast
Comparison: Previous exam(s).

CLINICAL DATA: 44-year-old female for six-month follow-up of LEFT
breast mass.

EXAM:
ULTRASOUND OF THE LEFT BREAST

[Series 1: us breast*left* limited inc axilla · 0.08mm/px · 4 of 4 slices shown]
[im 1/4]
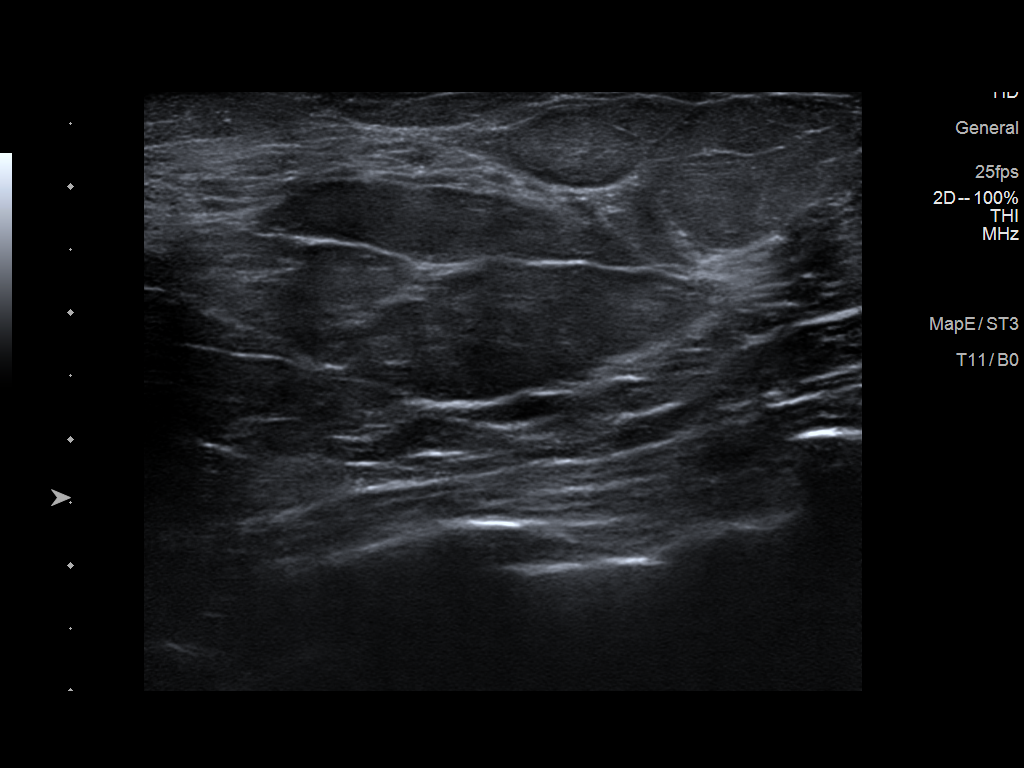
[im 2/4]
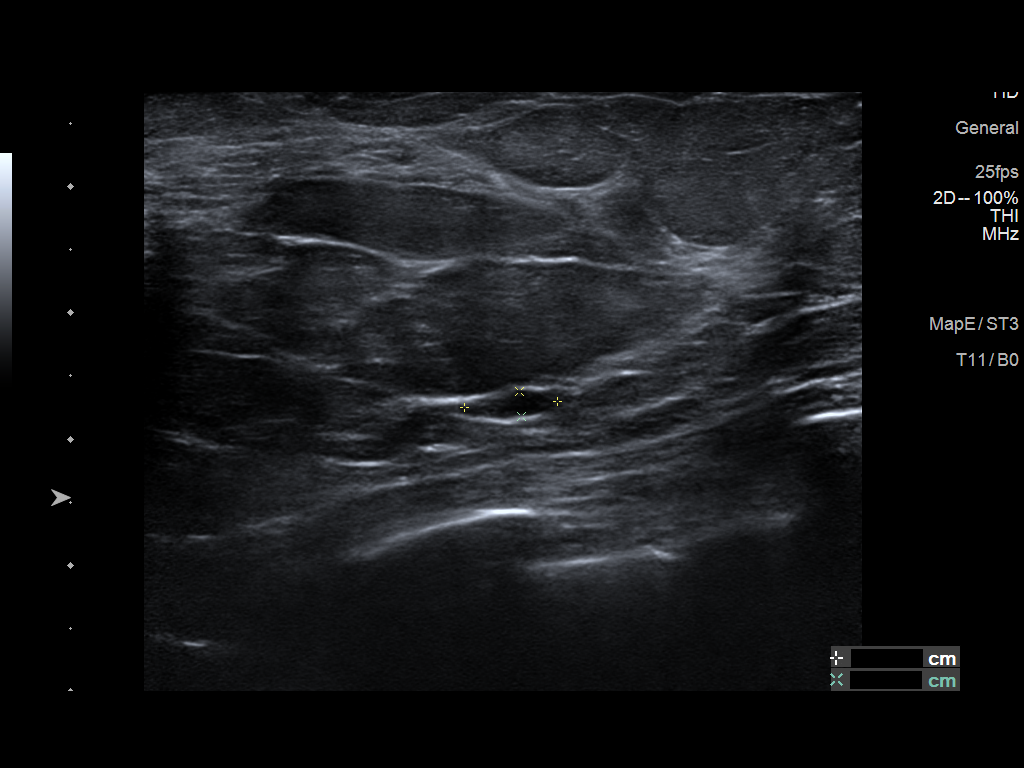
[im 3/4]
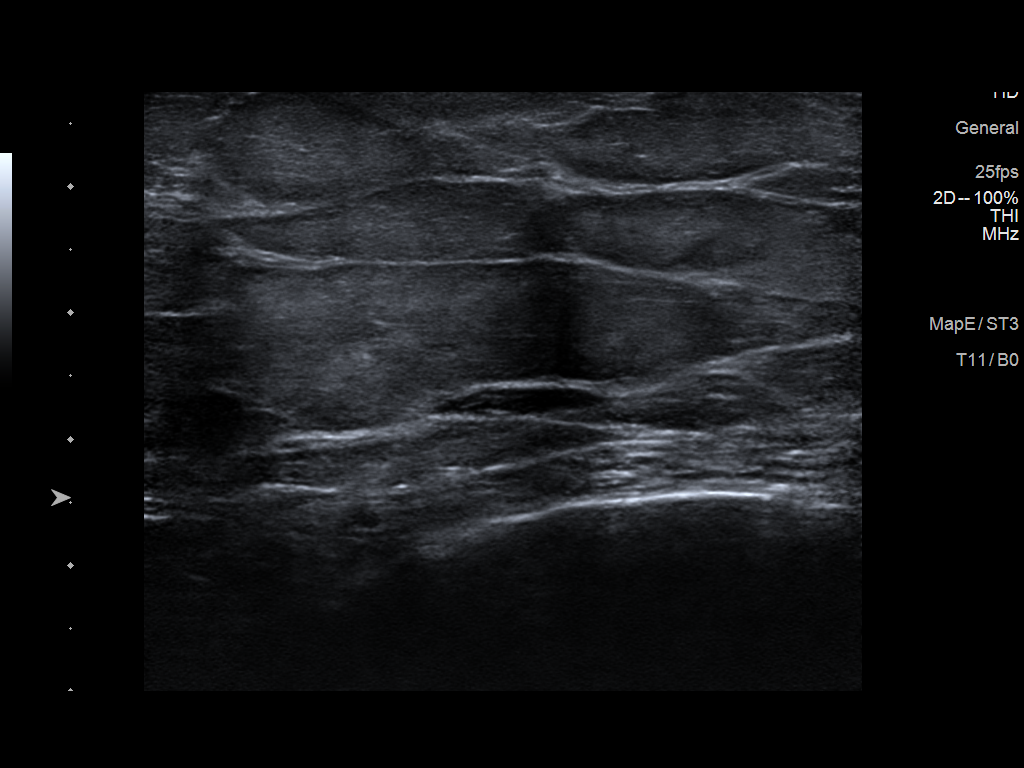
[im 4/4]
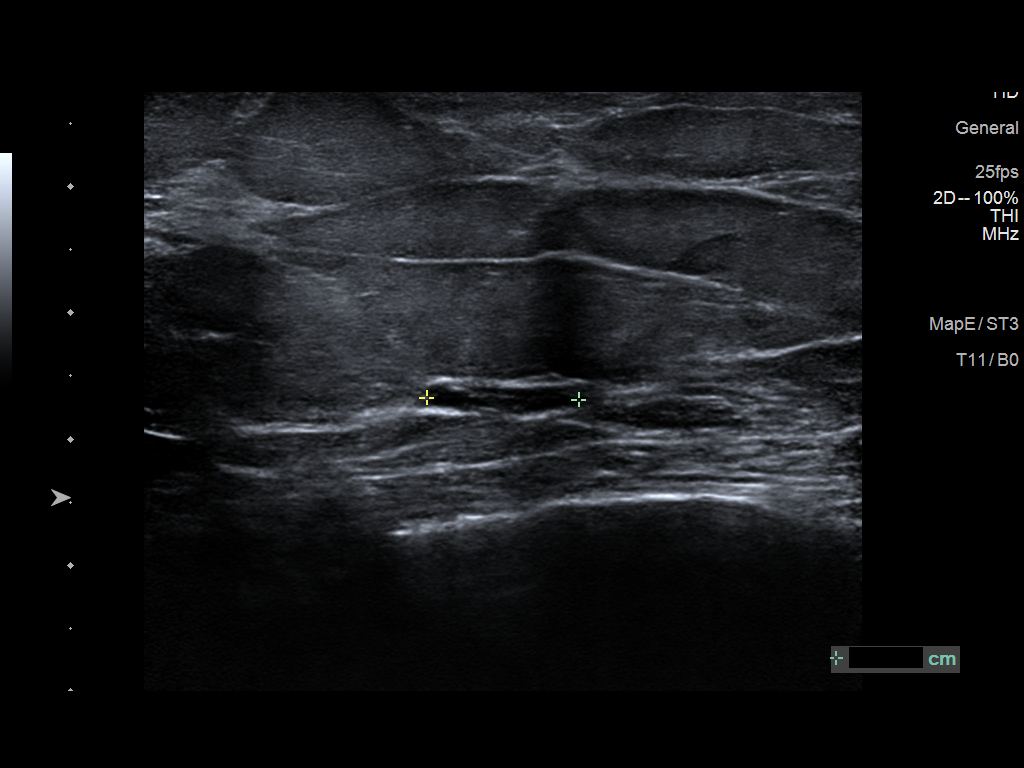

[4 of 4 positions shown; findings below may reference images not displayed]

FINDINGS: Targeted ultrasound is performed, showing a 0.7 x 0.2 x 1.2 cm
benign complicated cyst at the [DATE] position of the LEFT breast 3 cm
from the nipple, previously measuring 1 x 0.3 x 1.1 cm.
IMPRESSION: 1. Decreased size of benign complicated cyst within the UPPER LEFT
breast. No further imaging follow-up recommended.

RECOMMENDATION:
Bilateral screening mammogram in 6 months to resume annual mammogram
schedule.

I have discussed the findings and recommendations with the patient.
If applicable, a reminder letter will be sent to the patient
regarding the next appointment.

BI-RADS CATEGORY  2: Benign.

## 2022-04-25 DIAGNOSIS — E663 Overweight: Secondary | ICD-10-CM | POA: Diagnosis not present

## 2022-04-25 DIAGNOSIS — Z6829 Body mass index (BMI) 29.0-29.9, adult: Secondary | ICD-10-CM | POA: Diagnosis not present

## 2022-04-25 DIAGNOSIS — M545 Low back pain, unspecified: Secondary | ICD-10-CM | POA: Diagnosis not present

## 2022-04-25 DIAGNOSIS — M5431 Sciatica, right side: Secondary | ICD-10-CM | POA: Diagnosis not present

## 2022-05-06 DIAGNOSIS — K2900 Acute gastritis without bleeding: Secondary | ICD-10-CM | POA: Diagnosis not present

## 2022-05-06 DIAGNOSIS — K2950 Unspecified chronic gastritis without bleeding: Secondary | ICD-10-CM | POA: Diagnosis not present

## 2022-05-06 DIAGNOSIS — K2970 Gastritis, unspecified, without bleeding: Secondary | ICD-10-CM | POA: Diagnosis not present

## 2022-05-06 DIAGNOSIS — K222 Esophageal obstruction: Secondary | ICD-10-CM | POA: Diagnosis not present

## 2022-05-06 DIAGNOSIS — K208 Other esophagitis without bleeding: Secondary | ICD-10-CM | POA: Diagnosis not present

## 2022-05-06 DIAGNOSIS — K449 Diaphragmatic hernia without obstruction or gangrene: Secondary | ICD-10-CM | POA: Diagnosis not present

## 2022-05-06 DIAGNOSIS — K209 Esophagitis, unspecified without bleeding: Secondary | ICD-10-CM | POA: Diagnosis not present

## 2022-05-06 DIAGNOSIS — K317 Polyp of stomach and duodenum: Secondary | ICD-10-CM | POA: Diagnosis not present

## 2022-05-06 DIAGNOSIS — R1310 Dysphagia, unspecified: Secondary | ICD-10-CM | POA: Diagnosis not present

## 2022-05-06 DIAGNOSIS — K9171 Accidental puncture and laceration of a digestive system organ or structure during a digestive system procedure: Secondary | ICD-10-CM | POA: Diagnosis not present

## 2022-05-06 DIAGNOSIS — K2100 Gastro-esophageal reflux disease with esophagitis, without bleeding: Secondary | ICD-10-CM | POA: Diagnosis not present

## 2022-05-15 DIAGNOSIS — Z6828 Body mass index (BMI) 28.0-28.9, adult: Secondary | ICD-10-CM | POA: Diagnosis not present

## 2022-05-15 DIAGNOSIS — Z1231 Encounter for screening mammogram for malignant neoplasm of breast: Secondary | ICD-10-CM | POA: Diagnosis not present

## 2022-05-15 DIAGNOSIS — Z1272 Encounter for screening for malignant neoplasm of vagina: Secondary | ICD-10-CM | POA: Diagnosis not present

## 2022-05-15 DIAGNOSIS — Z01419 Encounter for gynecological examination (general) (routine) without abnormal findings: Secondary | ICD-10-CM | POA: Diagnosis not present

## 2022-05-15 DIAGNOSIS — Z1151 Encounter for screening for human papillomavirus (HPV): Secondary | ICD-10-CM | POA: Diagnosis not present

## 2022-05-30 DIAGNOSIS — J329 Chronic sinusitis, unspecified: Secondary | ICD-10-CM | POA: Diagnosis not present

## 2022-05-30 DIAGNOSIS — Z6829 Body mass index (BMI) 29.0-29.9, adult: Secondary | ICD-10-CM | POA: Diagnosis not present

## 2022-05-30 DIAGNOSIS — E663 Overweight: Secondary | ICD-10-CM | POA: Diagnosis not present

## 2022-09-03 DIAGNOSIS — R222 Localized swelling, mass and lump, trunk: Secondary | ICD-10-CM | POA: Diagnosis not present

## 2022-09-06 ENCOUNTER — Ambulatory Visit
Admission: RE | Admit: 2022-09-06 | Discharge: 2022-09-06 | Disposition: A | Discharge status: Home / Self Care | Payer: BC Managed Care – PPO | Source: Ambulatory Visit | Attending: Obstetrics & Gynecology | Admitting: Obstetrics & Gynecology

## 2022-09-06 ENCOUNTER — Other Ambulatory Visit: Payer: Self-pay

## 2022-09-06 DIAGNOSIS — R079 Chest pain, unspecified: Secondary | ICD-10-CM | POA: Diagnosis not present

## 2022-09-06 DIAGNOSIS — M79629 Pain in unspecified upper arm: Secondary | ICD-10-CM

## 2022-09-12 DIAGNOSIS — F419 Anxiety disorder, unspecified: Secondary | ICD-10-CM | POA: Diagnosis not present

## 2022-09-12 DIAGNOSIS — E663 Overweight: Secondary | ICD-10-CM | POA: Diagnosis not present

## 2022-09-12 DIAGNOSIS — Z6829 Body mass index (BMI) 29.0-29.9, adult: Secondary | ICD-10-CM | POA: Diagnosis not present

## 2022-09-12 DIAGNOSIS — D171 Benign lipomatous neoplasm of skin and subcutaneous tissue of trunk: Secondary | ICD-10-CM | POA: Diagnosis not present

## 2022-10-31 DIAGNOSIS — F4322 Adjustment disorder with anxiety: Secondary | ICD-10-CM | POA: Diagnosis not present

## 2022-10-31 DIAGNOSIS — F900 Attention-deficit hyperactivity disorder, predominantly inattentive type: Secondary | ICD-10-CM | POA: Diagnosis not present

## 2023-01-23 DIAGNOSIS — N959 Unspecified menopausal and perimenopausal disorder: Secondary | ICD-10-CM | POA: Diagnosis not present

## 2023-05-12 DIAGNOSIS — F900 Attention-deficit hyperactivity disorder, predominantly inattentive type: Secondary | ICD-10-CM | POA: Diagnosis not present

## 2023-05-12 DIAGNOSIS — F3342 Major depressive disorder, recurrent, in full remission: Secondary | ICD-10-CM | POA: Diagnosis not present

## 2023-07-15 DIAGNOSIS — M5440 Lumbago with sciatica, unspecified side: Secondary | ICD-10-CM | POA: Diagnosis not present

## 2023-07-15 DIAGNOSIS — K2100 Gastro-esophageal reflux disease with esophagitis, without bleeding: Secondary | ICD-10-CM | POA: Diagnosis not present

## 2023-07-15 DIAGNOSIS — K644 Residual hemorrhoidal skin tags: Secondary | ICD-10-CM | POA: Diagnosis not present

## 2023-07-15 DIAGNOSIS — K648 Other hemorrhoids: Secondary | ICD-10-CM | POA: Diagnosis not present

## 2023-09-01 DIAGNOSIS — R5382 Chronic fatigue, unspecified: Secondary | ICD-10-CM | POA: Diagnosis not present

## 2023-09-01 DIAGNOSIS — L658 Other specified nonscarring hair loss: Secondary | ICD-10-CM | POA: Diagnosis not present

## 2023-09-01 DIAGNOSIS — R891 Abnormal level of hormones in specimens from other organs, systems and tissues: Secondary | ICD-10-CM | POA: Diagnosis not present

## 2023-09-09 DIAGNOSIS — K644 Residual hemorrhoidal skin tags: Secondary | ICD-10-CM | POA: Diagnosis not present

## 2023-09-26 DIAGNOSIS — K642 Third degree hemorrhoids: Secondary | ICD-10-CM | POA: Diagnosis not present

## 2023-09-26 DIAGNOSIS — K644 Residual hemorrhoidal skin tags: Secondary | ICD-10-CM | POA: Diagnosis not present

## 2023-10-21 DIAGNOSIS — G8918 Other acute postprocedural pain: Secondary | ICD-10-CM | POA: Diagnosis not present

## 2023-11-13 DIAGNOSIS — F900 Attention-deficit hyperactivity disorder, predominantly inattentive type: Secondary | ICD-10-CM | POA: Diagnosis not present

## 2023-11-13 DIAGNOSIS — F3342 Major depressive disorder, recurrent, in full remission: Secondary | ICD-10-CM | POA: Diagnosis not present

## 2023-12-08 DIAGNOSIS — R5382 Chronic fatigue, unspecified: Secondary | ICD-10-CM | POA: Diagnosis not present

## 2023-12-08 DIAGNOSIS — L658 Other specified nonscarring hair loss: Secondary | ICD-10-CM | POA: Diagnosis not present

## 2023-12-08 DIAGNOSIS — R891 Abnormal level of hormones in specimens from other organs, systems and tissues: Secondary | ICD-10-CM | POA: Diagnosis not present

## 2023-12-22 DIAGNOSIS — E6609 Other obesity due to excess calories: Secondary | ICD-10-CM | POA: Diagnosis not present

## 2023-12-22 DIAGNOSIS — L237 Allergic contact dermatitis due to plants, except food: Secondary | ICD-10-CM | POA: Diagnosis not present

## 2023-12-22 DIAGNOSIS — Z6830 Body mass index (BMI) 30.0-30.9, adult: Secondary | ICD-10-CM | POA: Diagnosis not present

## 2024-01-06 DIAGNOSIS — K644 Residual hemorrhoidal skin tags: Secondary | ICD-10-CM | POA: Diagnosis not present

## 2024-03-01 DIAGNOSIS — R891 Abnormal level of hormones in specimens from other organs, systems and tissues: Secondary | ICD-10-CM | POA: Diagnosis not present

## 2024-03-01 DIAGNOSIS — R5382 Chronic fatigue, unspecified: Secondary | ICD-10-CM | POA: Diagnosis not present

## 2024-03-01 DIAGNOSIS — L658 Other specified nonscarring hair loss: Secondary | ICD-10-CM | POA: Diagnosis not present

## 2024-05-06 DIAGNOSIS — Z6828 Body mass index (BMI) 28.0-28.9, adult: Secondary | ICD-10-CM | POA: Diagnosis not present

## 2024-05-06 DIAGNOSIS — R319 Hematuria, unspecified: Secondary | ICD-10-CM | POA: Diagnosis not present

## 2024-05-06 DIAGNOSIS — B372 Candidiasis of skin and nail: Secondary | ICD-10-CM | POA: Diagnosis not present

## 2024-05-06 DIAGNOSIS — Z01419 Encounter for gynecological examination (general) (routine) without abnormal findings: Secondary | ICD-10-CM | POA: Diagnosis not present

## 2024-05-13 DIAGNOSIS — L658 Other specified nonscarring hair loss: Secondary | ICD-10-CM | POA: Diagnosis not present

## 2024-05-13 DIAGNOSIS — R891 Abnormal level of hormones in specimens from other organs, systems and tissues: Secondary | ICD-10-CM | POA: Diagnosis not present

## 2024-05-13 DIAGNOSIS — R5382 Chronic fatigue, unspecified: Secondary | ICD-10-CM | POA: Diagnosis not present

## 2024-06-03 DIAGNOSIS — F411 Generalized anxiety disorder: Secondary | ICD-10-CM | POA: Diagnosis not present

## 2024-06-03 DIAGNOSIS — Z600 Problems of adjustment to life-cycle transitions: Secondary | ICD-10-CM | POA: Diagnosis not present

## 2024-06-03 DIAGNOSIS — F902 Attention-deficit hyperactivity disorder, combined type: Secondary | ICD-10-CM | POA: Diagnosis not present

## 2024-06-10 DIAGNOSIS — F411 Generalized anxiety disorder: Secondary | ICD-10-CM | POA: Diagnosis not present

## 2024-06-10 DIAGNOSIS — F902 Attention-deficit hyperactivity disorder, combined type: Secondary | ICD-10-CM | POA: Diagnosis not present

## 2024-06-10 DIAGNOSIS — Z600 Problems of adjustment to life-cycle transitions: Secondary | ICD-10-CM | POA: Diagnosis not present

## 2024-06-17 DIAGNOSIS — F902 Attention-deficit hyperactivity disorder, combined type: Secondary | ICD-10-CM | POA: Diagnosis not present

## 2024-06-17 DIAGNOSIS — F411 Generalized anxiety disorder: Secondary | ICD-10-CM | POA: Diagnosis not present

## 2024-06-17 DIAGNOSIS — Z600 Problems of adjustment to life-cycle transitions: Secondary | ICD-10-CM | POA: Diagnosis not present

## 2024-06-21 DIAGNOSIS — R399 Unspecified symptoms and signs involving the genitourinary system: Secondary | ICD-10-CM | POA: Diagnosis not present

## 2024-06-21 DIAGNOSIS — R350 Frequency of micturition: Secondary | ICD-10-CM | POA: Diagnosis not present

## 2024-06-21 DIAGNOSIS — R310 Gross hematuria: Secondary | ICD-10-CM | POA: Diagnosis not present

## 2024-07-02 DIAGNOSIS — F411 Generalized anxiety disorder: Secondary | ICD-10-CM | POA: Diagnosis not present

## 2024-07-02 DIAGNOSIS — F902 Attention-deficit hyperactivity disorder, combined type: Secondary | ICD-10-CM | POA: Diagnosis not present

## 2024-07-02 DIAGNOSIS — Z600 Problems of adjustment to life-cycle transitions: Secondary | ICD-10-CM | POA: Diagnosis not present

## 2024-07-08 DIAGNOSIS — K7689 Other specified diseases of liver: Secondary | ICD-10-CM | POA: Diagnosis not present

## 2024-07-08 DIAGNOSIS — R319 Hematuria, unspecified: Secondary | ICD-10-CM | POA: Diagnosis not present

## 2024-07-08 DIAGNOSIS — R310 Gross hematuria: Secondary | ICD-10-CM | POA: Diagnosis not present

## 2024-07-22 DIAGNOSIS — F411 Generalized anxiety disorder: Secondary | ICD-10-CM | POA: Diagnosis not present

## 2024-07-22 DIAGNOSIS — F902 Attention-deficit hyperactivity disorder, combined type: Secondary | ICD-10-CM | POA: Diagnosis not present

## 2024-07-22 DIAGNOSIS — Z600 Problems of adjustment to life-cycle transitions: Secondary | ICD-10-CM | POA: Diagnosis not present

## 2024-07-29 DIAGNOSIS — F3342 Major depressive disorder, recurrent, in full remission: Secondary | ICD-10-CM | POA: Diagnosis not present

## 2024-07-29 DIAGNOSIS — F900 Attention-deficit hyperactivity disorder, predominantly inattentive type: Secondary | ICD-10-CM | POA: Diagnosis not present

## 2024-07-30 DIAGNOSIS — F902 Attention-deficit hyperactivity disorder, combined type: Secondary | ICD-10-CM | POA: Diagnosis not present

## 2024-07-30 DIAGNOSIS — F411 Generalized anxiety disorder: Secondary | ICD-10-CM | POA: Diagnosis not present

## 2024-07-30 DIAGNOSIS — Z600 Problems of adjustment to life-cycle transitions: Secondary | ICD-10-CM | POA: Diagnosis not present

## 2024-08-02 DIAGNOSIS — R310 Gross hematuria: Secondary | ICD-10-CM | POA: Diagnosis not present
